# Patient Record
Sex: Female | Born: 1985 | Race: Asian | Hispanic: No
Health system: Western US, Academic
[De-identification: ages and names within clinical notes are randomized; demographics above are authoritative.]

## PROBLEM LIST (undated history)

## (undated) DIAGNOSIS — L309 Dermatitis, unspecified: Secondary | ICD-10-CM

## (undated) HISTORY — PX: WISDOM TOOTH EXTRACTION: SHX21

## (undated) HISTORY — DX: Dermatitis, unspecified: L30.9

## (undated) DEATH — deceased

---
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
## 2008-09-22 ENCOUNTER — Other Ambulatory Visit (INDEPENDENT_AMBULATORY_CARE_PROVIDER_SITE_OTHER): Payer: Self-pay | Admitting: Nurse Practitioner

## 2008-09-22 ENCOUNTER — Other Ambulatory Visit (INDEPENDENT_AMBULATORY_CARE_PROVIDER_SITE_OTHER): Payer: 59

## 2008-09-22 DIAGNOSIS — Z1159 Encounter for screening for other viral diseases: Secondary | ICD-10-CM

## 2008-09-22 DIAGNOSIS — Z111 Encounter for screening for respiratory tuberculosis: Secondary | ICD-10-CM

## 2008-09-22 DIAGNOSIS — Z23 Encounter for immunization: Secondary | ICD-10-CM

## 2008-09-22 NOTE — Progress Notes (Signed)
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    Patient pregnant at this time?: No  (Pregnancy question asked if patient receiving Tdap or Flu.   No research done for Tdap and pregnancy.  Use preservative free Flu vaccine if pregnant.)    Has had a problem with any vaccines received in the past: NO    VIS read and immunization informed consent given by patient: YES --     Injection given today w/o adverse effect: Hepatitis B booster and Tdap          TITER DOCUMENTATION    Patient to lab for titer:  Varicella    "Hepatitis Serology Testing Information for [facility] Students"   given to patient: YES    Patient instructed to go to the lab for blood draw and informed that titer results will be mailed within two weeks.

## 2008-09-23 LAB — VZV IMMUNE STATUS BY IFA: Varicella Zoster Immune Status Result: >1:8 {titer}

## 2008-09-25 ENCOUNTER — Other Ambulatory Visit (INDEPENDENT_AMBULATORY_CARE_PROVIDER_SITE_OTHER): Payer: 59

## 2008-09-25 DIAGNOSIS — Z111 Encounter for screening for respiratory tuberculosis: Secondary | ICD-10-CM

## 2008-09-25 LAB — PR PPD/TUBERCULIN SKIN TEST 5 UNITS / 0.1 ML INTRADERMAL: PPD: 0

## 2008-09-25 NOTE — Progress Notes (Signed)
CONSENT    Patient consent to give immunization record to HSIP nurse:   YES    Reason for PPD?  HSIP    PPD read 00 mm.

## 2008-10-27 ENCOUNTER — Other Ambulatory Visit (INDEPENDENT_AMBULATORY_CARE_PROVIDER_SITE_OTHER): Payer: 59

## 2008-10-27 DIAGNOSIS — Z7189 Other specified counseling: Secondary | ICD-10-CM

## 2008-10-27 NOTE — Progress Notes (Signed)
To Lab for Hep B titer.  Annamary Carolin, RN

## 2008-11-04 ENCOUNTER — Other Ambulatory Visit (INDEPENDENT_AMBULATORY_CARE_PROVIDER_SITE_OTHER): Payer: 59

## 2008-11-04 DIAGNOSIS — Z23 Encounter for immunization: Secondary | ICD-10-CM

## 2008-11-04 NOTE — Progress Notes (Signed)
Flu Vaccine Screening Questions    Is patient pregnant at this time?  NO    Has the patient ever had a serious reaction to a prior dose of Influenza vaccine? No    Ever had a serious allergic reaction to eggs? No    Ever had Guillan-Barre syndrome associated with a vaccine? No    Currently have a moderate or severe illness, including a fever? No    Patient has reviewed the Influenza VIS and had all questions answered? YES    Verbal informed consent for Influenza vaccine obtained: YES     Flu vaccine given today without initial adverse effect.

## 2009-01-04 ENCOUNTER — Encounter (HOSPITAL_BASED_OUTPATIENT_CLINIC_OR_DEPARTMENT_OTHER): Payer: Self-pay | Admitting: Obstetrics & Gynecology

## 2009-01-08 ENCOUNTER — Ambulatory Visit: Payer: BLUE CROSS/BLUE SHIELD | Attending: Obstetrics & Gynecology | Admitting: Obstetrics & Gynecology

## 2009-01-08 DIAGNOSIS — Z139 Encounter for screening, unspecified: Secondary | ICD-10-CM | POA: Insufficient documentation

## 2009-01-08 DIAGNOSIS — Z01419 Encounter for gynecological examination (general) (routine) without abnormal findings: Secondary | ICD-10-CM

## 2009-01-18 ENCOUNTER — Encounter (HOSPITAL_BASED_OUTPATIENT_CLINIC_OR_DEPARTMENT_OTHER): Payer: Self-pay | Admitting: Obstetrics & Gynecology

## 2010-12-12 ENCOUNTER — Encounter (INDEPENDENT_AMBULATORY_CARE_PROVIDER_SITE_OTHER): Payer: Self-pay

## 2011-11-30 ENCOUNTER — Encounter (HOSPITAL_BASED_OUTPATIENT_CLINIC_OR_DEPARTMENT_OTHER): Payer: Self-pay | Admitting: Obstetrics & Gynecology

## 2011-11-30 ENCOUNTER — Ambulatory Visit: Payer: 59 | Attending: Obstetrics & Gynecology | Admitting: Obstetrics & Gynecology

## 2011-11-30 VITALS — BP 113/73 | HR 82 | Ht 62.0 in | Wt 135.0 lb

## 2011-11-30 DIAGNOSIS — L259 Unspecified contact dermatitis, unspecified cause: Secondary | ICD-10-CM | POA: Insufficient documentation

## 2011-11-30 DIAGNOSIS — B379 Candidiasis, unspecified: Secondary | ICD-10-CM

## 2011-11-30 DIAGNOSIS — B49 Unspecified mycosis: Secondary | ICD-10-CM

## 2011-11-30 DIAGNOSIS — B373 Candidiasis of vulva and vagina: Secondary | ICD-10-CM | POA: Insufficient documentation

## 2011-11-30 DIAGNOSIS — L309 Dermatitis, unspecified: Secondary | ICD-10-CM | POA: Insufficient documentation

## 2011-11-30 DIAGNOSIS — Z01419 Encounter for gynecological examination (general) (routine) without abnormal findings: Secondary | ICD-10-CM | POA: Insufficient documentation

## 2011-11-30 DIAGNOSIS — R300 Dysuria: Secondary | ICD-10-CM | POA: Insufficient documentation

## 2011-11-30 DIAGNOSIS — B3731 Acute candidiasis of vulva and vagina: Secondary | ICD-10-CM | POA: Insufficient documentation

## 2011-11-30 LAB — PR U/A NONAUTO DIPSTICK ONLY, ONSITE
Bilirubin (Indirect): NEGATIVE
Glucose, Urine: NEGATIVE mg/dL
Ketones, URN: NEGATIVE mg/dL
Leukocytes: NEGATIVE
Nitrite, URN: NEGATIVE
Protein: NEGATIVE mg/dL
pH, Whole Blood: 6 (ref 5.0–8.0)

## 2011-11-30 LAB — PR WET MOUNTS INCL PREP VAGINAL CERV/SKIN SPECIMENS, ONSITE

## 2011-11-30 MED ORDER — FLUCONAZOLE 150 MG OR TABS
ORAL_TABLET | ORAL | Status: AC
Start: 2011-11-30 — End: ?

## 2011-11-30 NOTE — Progress Notes (Signed)
Gynecologic Preventive Health Visit    ID/CC: 26 year old female presents for her annual preventive health visit    HPI: Amy Benson presents for the above.     3 months ago had a UTI which was treated with abx.  Then a couple of weeks ago had UTI which was not treated with abx and also had a yeast infection.  The yeast infection was new for her, had itching and white discharge.  Used OTC and resolved.  Both of the UTI sx were right after sex. Not having sex that often.  Not having these sx after every episode of sex.     Currently has a vague dysuria, using cranberry pills.      Has had mid-cycle spotting for last 2 cycles, spotting lasted 1-2 days.     Using condoms for birth control, but in last few months feeling more irritated even with latex free condoms.     OB/Gynecologic history:  1. Menses: LMP was Patient's last menstrual period was 11/23/2011. ,   Contraception: currently using condoms.   STI: none  Pap smear history: no h/o abnormal    G0P0    ROS:  Extended 2-9, Complete 10+  ROS per patient's intake form dated 11/30/2011.      Past Medical History  Past Medical History   Diagnosis Date   . Eczema      Past Surgical History  Past Surgical History   Procedure Date   . Removal of tonsils      Family history:   No family history on file.  SocHx:  History     Social History   . Marital Status: Single     Spouse Name: N/A     Number of Children: N/A   . Years of Education: N/A     Occupational History   . Not on file.     Social History Main Topics   . Smoking status: Never Smoker    . Smokeless tobacco: Not on file   . Alcohol Use:    . Drug Use:    . Sexually Active:      Other Topics Concern   . 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        Physical Exam:  1995   Detailed - 5-7 systems, Comprehensive -8+  Filed Vitals:    11/30/11 1017   BP: 113/73   Pulse: 82   Height: 5\' 2"  (1.575 m)   Weight: 135 lb (61.236 kg)       Gen: well appearing,  nad, ambulates without assistance   Neurological: gait normal.   Psychiatric:oriented x 3, mood and affect appropriate.  Neck: : no masses or thyromegaly  Breasts: symmetric, no masses, nipple discharge or lymphadenopathy  Lymphatic: no axillary or inguinal lymphadenopathy  Cardiovascular: regular rate, 2+ peripheral pulses  Respiratory: good respiratory effort, no use of accessory muscles  Gastrointestinal: soft, nontender, nondistended.  No masses, hernias, or hepatosplenomegaly.  Genitourinary: normal external female genitalia, normal bartholins, skenes, urethral meatus, anus, and perineum.  Cervix  nulliparous without lesions. Bimanual, uterus anteverted, mobile, nontender, no adnexal masses or tenderness.    Skin: on abd and back: patches of pink, scaly skin.     Office Visit on 11/30/11   U/A NONAUTO DIPSTICK ONLY       Component Value Range    Color, URN yellow  Clarity, URN clear      Glucose, Urine neg  NEG mg/dL    Bilirubin (Indirect) neg  NEG    Ketones, URN neg  NEG mg/dL    Specific Gravity    1.005 - 1.030    Occult Blood, URN hemolyzed trace  NEG    Ph 6  5.0 - 8.0    Protein neg  NEG-TRACE mg/dL    Urobilinogen, URN norm  NORMAL E.U./dL    Nitrite, URN neg  NEG    Leukocytes neg  NEG   WET MOUNTS W/PREP VAG CERV/SKN SPEC       Component Value Range    Odor none      WBC 1+      RBC none      Bacteria 2+      Clue Cells none      Yeast 2+      Epithelial Cells 3+      Trich none      Other pH 4.5           Impression: 25 year old female presents for preventive health exam and age-appropriate counseling.    ASSESSMENT AND PLAN:     1. Preventive Health Visit   Pap smear: done today. If negative, can space q3 years   Chlamydia/Gonorrhea screen: declined  Screening for other STD's including HIV: declined   Cholesterol screening: n/a   Mammography: n/a  Colonoscopy: n/a   Preventive counseling: contraceptive needs, MVI w/folate to prevent NTD's, breast self-exam, skin Ca prevention & skin self-exam,  importance of regular dental care, moderation in EtOH, cessation of tobacco use, use of illicit drugs, healthy dietary guidelines, adequate calcium intake  (1200-1500mg /d) and vitamin D (600-800IU/d), proper exercise.     2. Immunizations:   Has had flu vaccine at work    3. BCM: will continue to use condoms unless consistently irritated    4. Yeast vaginitis:  Fluconazole 150 mg PO x 1    5. Eczema: encouraged her to use the steroid ointment. F/u with derm if not improved.

## 2011-12-02 LAB — URINE C/S: Colony Count: 4000

## 2011-12-04 ENCOUNTER — Encounter (HOSPITAL_BASED_OUTPATIENT_CLINIC_OR_DEPARTMENT_OTHER): Payer: Self-pay | Admitting: Obstetrics & Gynecology

## 2011-12-04 LAB — CERVICAL CANCER SCREENING: Cytologic Impression: NEGATIVE

## 2012-01-10 ENCOUNTER — Ambulatory Visit: Payer: 59 | Attending: Dermatology | Admitting: Dermatology

## 2012-01-10 DIAGNOSIS — R21 Rash and other nonspecific skin eruption: Secondary | ICD-10-CM | POA: Insufficient documentation

## 2012-02-23 ENCOUNTER — Encounter (HOSPITAL_BASED_OUTPATIENT_CLINIC_OR_DEPARTMENT_OTHER): Payer: 59 | Admitting: Dermatology

## 2012-02-28 ENCOUNTER — Encounter (HOSPITAL_BASED_OUTPATIENT_CLINIC_OR_DEPARTMENT_OTHER): Payer: 59 | Admitting: Dermatology

## 2012-12-26 ENCOUNTER — Other Ambulatory Visit: Payer: Self-pay

## 2013-05-21 LAB — HM PAP SMEAR: HM Pap smear: NORMAL

## 2013-09-09 ENCOUNTER — Other Ambulatory Visit: Payer: Self-pay | Admitting: Occupational Medicine

## 2013-09-09 ENCOUNTER — Ambulatory Visit: Payer: Self-pay

## 2013-09-09 DIAGNOSIS — R7612 Nonspecific reaction to cell mediated immunity measurement of gamma interferon antigen response without active tuberculosis: Secondary | ICD-10-CM

## 2014-01-01 ENCOUNTER — Other Ambulatory Visit: Payer: Self-pay

## 2014-02-27 ENCOUNTER — Encounter: Payer: Self-pay | Admitting: Family

## 2014-02-27 ENCOUNTER — Ambulatory Visit (INDEPENDENT_AMBULATORY_CARE_PROVIDER_SITE_OTHER): Payer: 59 | Admitting: Family

## 2014-02-27 VITALS — BP 90/70 | HR 56 | Resp 16 | Ht 63.0 in | Wt 133.6 lb

## 2014-02-27 DIAGNOSIS — R591 Generalized enlarged lymph nodes: Secondary | ICD-10-CM

## 2014-02-27 DIAGNOSIS — R599 Enlarged lymph nodes, unspecified: Secondary | ICD-10-CM

## 2014-02-27 DIAGNOSIS — Z331 Pregnant state, incidental: Secondary | ICD-10-CM

## 2014-02-27 DIAGNOSIS — K219 Gastro-esophageal reflux disease without esophagitis: Secondary | ICD-10-CM

## 2014-02-27 DIAGNOSIS — Z3490 Encounter for supervision of normal pregnancy, unspecified, unspecified trimester: Secondary | ICD-10-CM | POA: Insufficient documentation

## 2014-02-27 MED ORDER — PRENATAL VITAMINS 0.8 MG PO TABS: 1.0000 | ORAL_TABLET | Freq: Every day | ORAL | Status: AC

## 2014-02-27 NOTE — Assessment & Plan Note (Signed)
Resolved. Advised pt to call if LN enlarges again.

## 2014-02-27 NOTE — Patient Instructions (Signed)
Please keep your upcoming appointment with OB/GYN. Continue prenatal vitamin. Follow up after 4/16 for complete physical.

## 2014-02-27 NOTE — Assessment & Plan Note (Signed)
Stable on PPI.  Pregnancy category B. Continue same.

## 2014-02-27 NOTE — Assessment & Plan Note (Signed)
Continue Prenatal vitamins. Management per OB.

## 2014-02-27 NOTE — Progress Notes (Signed)
Subjective:    Patient ID: Desiree Kim, female    DOB: 1985-09-05, 29 y.o.   MRN: [id number]  HPI   Desiree Kim is a 29 yr old female who presents today to establish care. She is [redacted] weeks pregnant- sees Dr. Renaldo Fiddler.   GERD- Pt is currently on protonix, reports that her GYN is aware that she is taking protonix.  Reports that last January she had endoscopy- looked "ok" per pt.  GERD symptoms worsened initially with her pregnancy but are now under better control since she started protonix.    Reports that she has had a swollen lymph node x several months on the right side of her neck. Now improved. Denies fevers.     Review of Systems  Constitutional: Negative for unexpected weight change.  HENT: Negative for rhinorrhea.   Respiratory: Negative for cough and shortness of breath.   Cardiovascular: Negative for chest pain.  Gastrointestinal: Negative for diarrhea and constipation.       Some nausea due to pregnancy  Genitourinary: Negative for dysuria and frequency.  Musculoskeletal: Negative for myalgias and arthralgias.  Skin: Negative for rash.  Neurological: Negative for headaches.  Hematological: Positive for adenopathy.  Psychiatric/Behavioral:       Denies depression/anxiety   History reviewed. No pertinent past medical history.  History   Social History  . Marital Status: Married    Spouse Name: N/A    Number of Children: N/A  . Years of Education: N/A   Occupational History  . Not on file.   Social History Main Topics  . Smoking status: Never Smoker   . Smokeless tobacco: Never Used  . Alcohol Use: No  . Drug Use: Not on file  . Sexual Activity: Not on file   Other Topics Concern  . Not on file   Social History Narrative   Pediatric nurse at [employer]   Originally from [place]   Married   Enjoys hanging out watching TV, exercise- enjoys tennis.    Past Surgical History  Procedure Laterality Date  . Tonsillectomy and adenoidectomy  1993  . Wisdom tooth  extraction      Family History  Problem Relation Age of Onset  . Hyperlipidemia Mother   . Hypertension Mother   . Other Mother     pre-diabetes  . Atrial fibrillation Father   . Diabetes Maternal Grandfather   . Cancer Paternal Grandfather     stomach  . Klinefelter's syndrome Brother     No Known Allergies  No current outpatient prescriptions on file prior to visit.   No current facility-administered medications on file prior to visit.    BP 90/70 mmHg  Pulse 56  Resp 16  Ht  (1.6 m)  Wt 133 lb 9.6 oz (60.601 kg)  BMI 23.67 kg/m2  SpO2 97%  LMP 12/04/2013       Objective:   Physical Exam  Constitutional: She is oriented to person, place, and time. She appears well-developed and well-nourished. No distress.  HENT:  Head: Normocephalic and atraumatic.  Right Ear: Tympanic membrane and ear canal normal.  Left Ear: Tympanic membrane and ear canal normal.  Mouth/Throat: No oropharyngeal exudate, posterior oropharyngeal edema or posterior oropharyngeal erythema.  Neck: Neck supple.  Non-tender, mobile sub centimeter lymph node noted beneath the right jaw.    Cardiovascular: Normal rate and regular rhythm.   No murmur heard. Pulmonary/Chest: Effort normal and breath sounds normal. No respiratory distress. She has no wheezes. She has no rales.  She exhibits no tenderness.  Abdominal: Soft. She exhibits no distension.  Neurological: She is alert and oriented to person, place, and time.  Skin: Skin is warm and dry.  Psychiatric: She has a normal mood and affect. Her behavior is normal. Judgment and thought content normal.          Assessment & Plan:

## 2014-09-30 ENCOUNTER — Inpatient Hospital Stay (HOSPITAL_COMMUNITY): Admission: AD | Admit: 2014-09-30 | Payer: 59 | Source: Ambulatory Visit | Admitting: Obstetrics and Gynecology

## 2015-04-07 ENCOUNTER — Other Ambulatory Visit: Payer: Self-pay

## 2015-12-30 IMAGING — CR DG CHEST 1V
1 series · 1 of 1 positions shown · non-contrast
Comparison: None.

CLINICAL DATA: Positive TB test.

EXAM:
CHEST - 1 VIEW

[view not recorded]
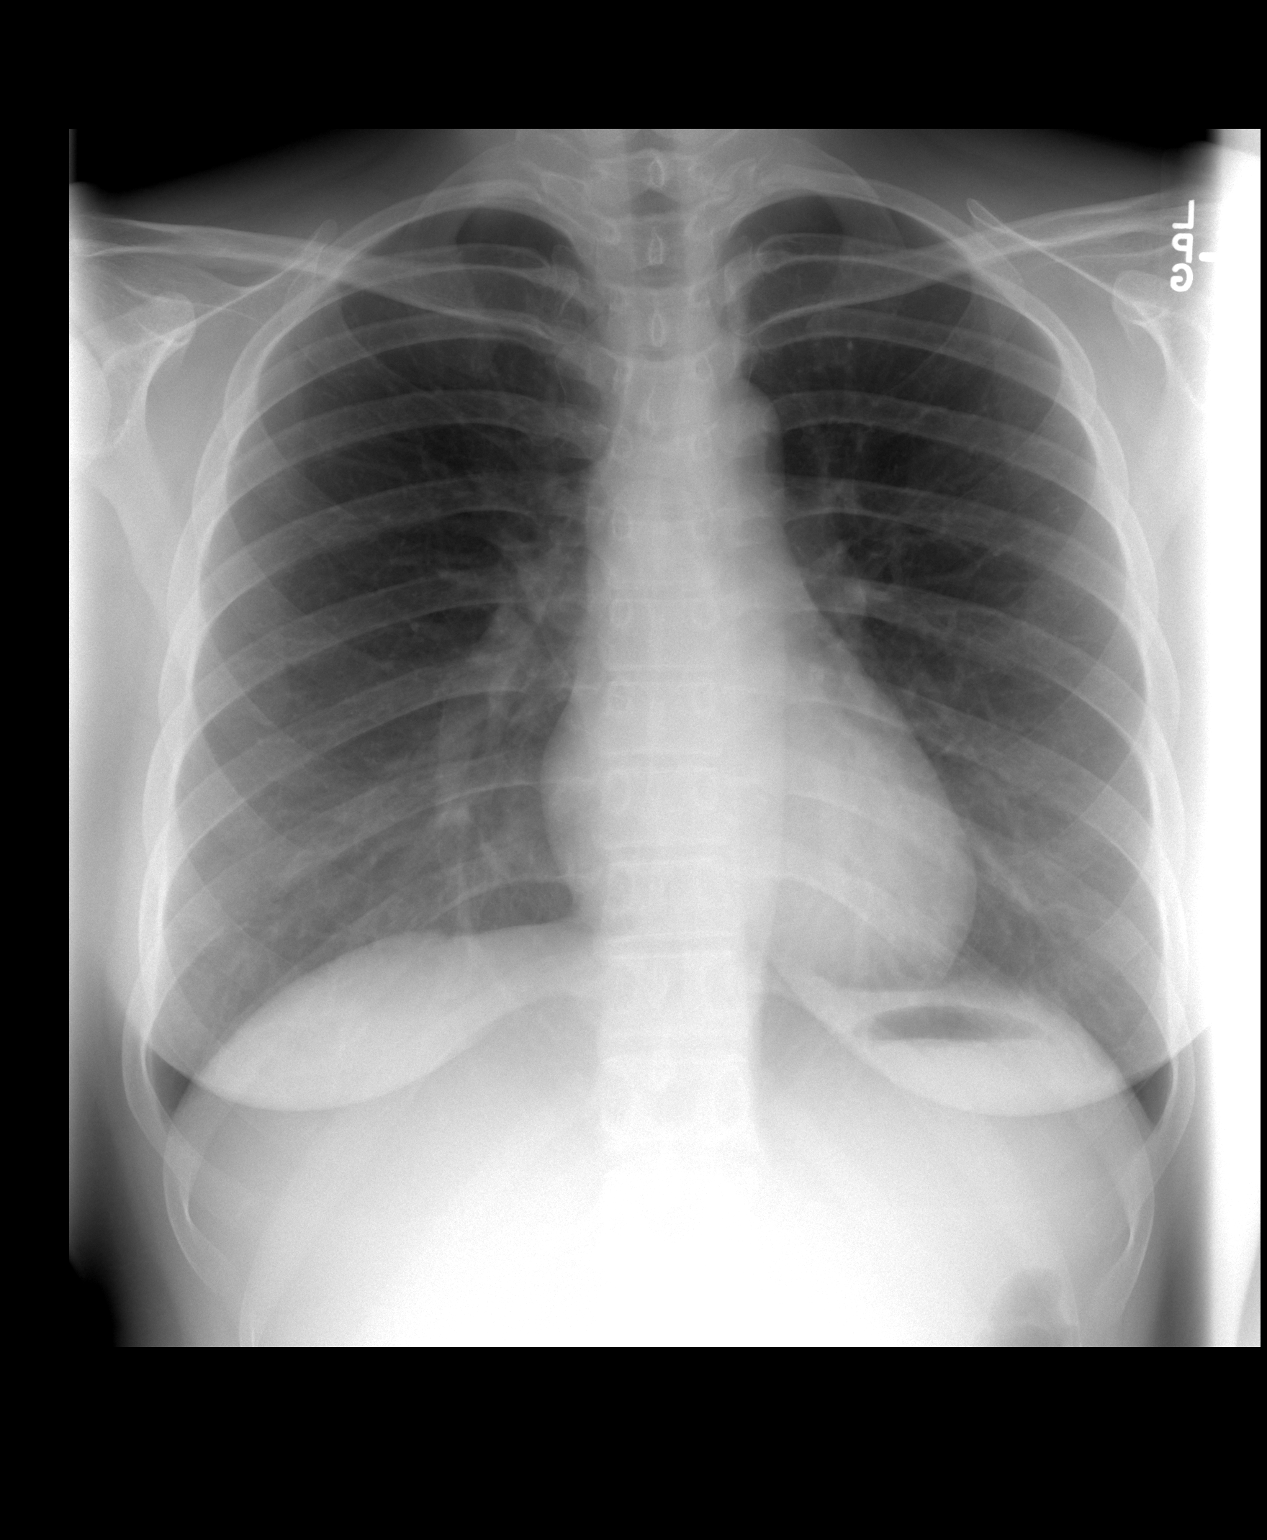

[1 of 1 positions shown; findings below may reference images not displayed]

FINDINGS: Mediastinum hilar structures normal. Lungs are clear. No focal
pulmonary infiltrate. No pleural effusion or pneumothorax. No
radiographic evidence for TB. Heart size normal. No acute bony
abnormality.
IMPRESSION: Negative chest.

## 2017-05-08 LAB — OTHER LAB ORDER
Hemoglobin A1C: 5.2 % — NL (ref 4.8–5.6)
Mean Blood Glucose Estimate: 103 mg/dL — NL

## 2018-03-25 ENCOUNTER — Encounter (HOSPITAL_BASED_OUTPATIENT_CLINIC_OR_DEPARTMENT_OTHER): Payer: Self-pay | Admitting: Psychiatry

## 2018-03-25 ENCOUNTER — Encounter (HOSPITAL_BASED_OUTPATIENT_CLINIC_OR_DEPARTMENT_OTHER): Payer: No Typology Code available for payment source | Admitting: Psychiatry

## 2018-03-25 NOTE — Telephone Encounter (Signed)
Transfer to clinc

## 2018-04-22 ENCOUNTER — Encounter (HOSPITAL_BASED_OUTPATIENT_CLINIC_OR_DEPARTMENT_OTHER): Payer: No Typology Code available for payment source | Admitting: Psychiatry

## 2018-05-20 ENCOUNTER — Telehealth (HOSPITAL_BASED_OUTPATIENT_CLINIC_OR_DEPARTMENT_OTHER): Payer: Self-pay

## 2018-05-20 NOTE — Telephone Encounter (Signed)
RETURN CALL: Voicemail - Detailed Message      SUBJECT:  Appointment Request     REASON FOR VISIT: telehealth/ per referral  PREFERRED DATE/TIME: please coordinate with patient  ADDITIONAL INFORMATION: no telehealth scheduling instructions/ Ccr attempted to transfer but all staff was assisting other patients.

## 2018-05-22 ENCOUNTER — Encounter (HOSPITAL_BASED_OUTPATIENT_CLINIC_OR_DEPARTMENT_OTHER): Payer: Self-pay

## 2018-05-22 NOTE — Telephone Encounter (Signed)
Patient scheduled for a telehealth appt with Dr. Nona Dell

## 2018-05-27 ENCOUNTER — Ambulatory Visit: Payer: No Typology Code available for payment source | Admitting: Psychiatry

## 2018-05-27 DIAGNOSIS — F419 Anxiety disorder, unspecified: Secondary | ICD-10-CM

## 2018-05-27 NOTE — Progress Notes (Signed)
PERINATAL PSYCHIATRY CLINIC INITIAL EVALUATION      DATE: 05/27/2018   TYPE OF SERVICE: Initial Evaluation      This telehealth patient encounter was conducted from the [facility] in [place], [place], via secure, live, face to face video conferencing to the patient.   This visit was conducted via telehealth instead of face-to-face because of the risk of COVID-19 exposure inherent in being physically present in the company of others. Briefly reviewed risks and benefits of telemedicine, and billing for telemedicine, and patient agreed to proceed with telemedicine appointment.    Patient's location during encounter:     Location description (home, office, etc.) Home  Street address: [address]  [place] and state: [place], [place]    Telephone number used for this encounter: zoom  Emergency contact name and telephone number reviewed in patient chart and is up to date: Yes    In addition to the patient and the provider, the following others were present during the encounter:None    Prior to the interview, the provider verified the patient's identity by asking for his or her name and date of birth. The provider informed the patient of their physical location and showed his or her badge. No recordings are kept from this encounter.     Emergency plan:  In the event of an emergency, the provider may ask the patient and/or family member/caregiver to contact 911. If it is not possible for the patient or someone at their location to contact 911, the provider will contact 911 and provide the patient's location. The patient was informed of this safety plan and verbally consented to it.       ID: Amy Benson 33 year old female with anxiety here for preconception counseling    EDD / DOD: N/A    CC: "I want more information about Lexapro in pregnancy"    HPI:  Amy Benson had onset of symptoms 3 years ago in the immediate postpartum period. She developed  spontaneous episodes of symptoms which she refers to as "psychosomatic symptoms" -  lightheadedness and heat intolerance which lasted for 30 - 40 min. Denies palpitation, shortness of breath worries or fear however does report that over the course of several months she began to avoid identified triggers of these episodes including being out in the sun, leading her to reduce how much she played outside with her daughter. Another trigger she avoids is driving on the freeway for more than about 15 min as she develops similar symptoms then. She never has episodes at home or at work.    She also reports fatigue when she went back to work at 5 months postpartum, but that has improved over time, and does not interfere with her functioning.  She denies pervasive low mood, hopelessness, irritability, decreased concentration.    She says she is not a Chiropractor" and does not get anxious even in the context of stressors. Her 44 year old daughter Amy Benson special needs with a rare neurodevelopmental disorder related to GRIN2B mutation and manifesting as developmental delay, hypotonia and autism spectrum disorder. She is able to walk but has feeding difficulties and has a NGT. Amy Benson did not want to dwell on it as she feels she is coping adequately and it is unrelated to her presenting complaint.     She tried several interventions including detailed assessment by endocrinology which was normal, cortisol treatment from a naturopath, acupuncture, chinese herbal medicine and when these were ineffective she has been  receiving escitalopram and occasional [place]praz[place]am since Oct 2019 from [place], with good effect. She had a headache and nausea when she started the medication which resolved and she is now on 7.5 mg daily.    She denies symptoms of hypomania / mania/ psychosis / OCD.    Screening questionnaire scores:  AUDIT neg  DAST neg  PCL 5 neg  MDQ neg  PHQ-9 0  GAD 1    PAST PSYCHIATRIC HISTORY:  Inpatient: None  Outpatient:  Amy Benson, [place] since Aug 2019  Peripartum: 3 years ago - postpartum onset of anxiety symptoms  Diagnoses:Anxiety, panic disorder  Medications: Escitalopram 7.5 mg daily, Alprazolam 0.5 mg as needed (1 - 2 / month)  Suicide attempts:None    PAST MEDICAL HISTORY:  Assessed by endocrinology - normal    OBSTETRIC HISTORY:  Pregnancy history: G1  Labor and delivery:NSVD  Breastfeeding: breastfed for 4 months, pumped for 11. 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      DRUG AND ALCOHOL/OTHER:   None  FAMILY HISTORY:  None    LABS:  TSH 3/19 normal    ALLERGIES:  NKDA    PERTINENT ROS:     Nausea No, Denies problems in this area       Rigidity/stiffness No, Denies problems in this area  Dizziness No, Denies problems in this area  Diarrhea No, Denies problems in this area  Sedation No, Denies problems in this area  Shortness of breath No, Denies problems in this area  Tremor No, Denies problems in this area  Headache No, Denies problems in this area  Other: see HPI    MENTAL STATUS EXAMINATION:  Well groomed, good eye contact, well engaged in interview. No psychomotor agitation or retardation evident on camera. Good eye contact. Speech regular rate and rhythm. Mood ok Affect euthymic. Thought process linear logical and goal directed. Thought content no SI/ HI/ AVH. Oriented. Attention normal. Memory normal on interview. Insight and Judgment normal.    MEDICATION SIDE EFFECTS:   None  MEDICATIONS:   Current Outpatient Medications   Medication Sig Dispense Refill   . Fluconazole 150 MG Oral Tab Take 1 tablet by mouth as a single dose 1 Tab 0     No current facility-administered medications for this visit.        MEDICATION COMPLIANCE: Good    TREATMENT PLAN REVIEWED?      ASSESSMENT: 33 yo  married G1 with anxiety, presenting for preconception counseling. She has atypical anxiety symptoms which have responded to 7.5 mg escitalopram and is requesting information on its safety in pregnancy. She has been evaluated by her PCP and by Endocrinology with no indication of contributing medical problems. We discussed known risks of escitalopram in pregnancy including small risk of preterm birth / low birth weight, persistent pulmonary hypertension, and more consistently reported risk of neonatal adaptation syndrome, and no increased risk of congenital malformations. We discussed the risks of untreated anxiety during pregnancy and the role of behavioral interventions and psychotherapy. As her symptoms are atypical she would probably benefit from exposure response prevention. We discussed that if she wants to trial off medication we would ideally like to monitor for sustained remission of symptoms for at least 3  months before she conceives. She will discuss all of this with her husband and return for consultation.    DIAGNOSES:   Anxiety Disorder unspecified.    TREATMENT/INTERVENTION:   Continue escitalopram 7.5 mg  Return in 2 weeks

## 2018-05-28 ENCOUNTER — Encounter (HOSPITAL_BASED_OUTPATIENT_CLINIC_OR_DEPARTMENT_OTHER): Payer: Self-pay

## 2018-06-03 ENCOUNTER — Ambulatory Visit: Payer: No Typology Code available for payment source | Attending: Psychiatry | Admitting: Psychiatry

## 2018-06-03 ENCOUNTER — Encounter (HOSPITAL_BASED_OUTPATIENT_CLINIC_OR_DEPARTMENT_OTHER): Payer: Self-pay

## 2018-06-03 DIAGNOSIS — F419 Anxiety disorder, unspecified: Secondary | ICD-10-CM | POA: Insufficient documentation

## 2018-06-03 MED ORDER — SERTRALINE HCL 25 MG OR TABS
ORAL_TABLET | ORAL | 2 refills | Status: AC
Start: 2018-06-03 — End: ?

## 2018-06-03 NOTE — Progress Notes (Signed)
PERINATAL PSYCHIATRY CLINIC PROGRESS NOTE    DATE: 06/03/2018   TYPE OF SERVICE: Evaluation and management     This telehealth patient encounter was conducted from the [facility] [place], [place], via secure, live, face to face video conferencing to the patient.   This visit was conducted via telehealth instead of face-to-face because of the risk of COVID-19 exposure inherent in being physically present in the company of others.    Briefly reviewed risks and benefits of telemedicine, and billing for telemedicine, and patient agreed to proceed with telemedicine appointment.    Patient's location during encounter:     Location description (home, office, etc.) Home  Street address: [address] [place]    Telephone number used for this encounter: zoom  Emergency contact name and telephone number reviewed in patient chart and is up to date: yes  In addition to the patient and the provider, the following others were present during the encounter: None    Prior to the interview, the provider verified the patient's identity by asking for his or her name and date of birth. The provider informed the patient of their physical location and showed his or her badge. No recordings are kept from this encounter.     Emergency plan:  In the event of an emergency, the provider may ask the patient and/or family member/caregiver to contact 911. If it is not possible for the patient or someone at their location to contact 911, the provider will contact 911 and provide the patient's location. The patient was informed of this safety plan and verbally consented to it.     ID: Amy Benson 33 year old female with 33 year old female with anxiety here for preconception counseling    Chief Complaint  Doing well    EDD / DOD:  N/A    PROBLEM/INTERVAL HISTORY:   Has thought over our discussion from last week and discussed it with her husband.  [facility] [place] [facility] [place]  for a few months in the summer and sees this as a time of potentially increase stress. She would like to try tapering off meds in September, allowing for 3 months monitoring off meds before she starts trying to conceive.  In the meantime she wants to cross taper to sertraline given that there is more safety data for its use in pregnancy. This way, even if she tapers off meds before she gets pregnant we will know whether or not she responds to sertraline in case she ends up needing medications while pregnant.  Has concerns about being on too high a dose of meds. We discussed the importance of being on the minimum effective dose.     DRUG AND ALCOHOL/OTHER:   None    PERTINENT ROS:     Nausea No, Denies problems in this area       Rigidity/stiffness No, Denies problems in this area  Dizziness No, Denies problems in this area  Diarrhea No, Denies problems in this area  Sedation No, Denies problems in this area  Shortness of breath No, Denies problems in this area  Tremor No, Denies problems in this area  Headache No, Denies problems in this area  Other: see HPI    UPDATED PAST PSYCHIATRIC, MEDICAL, FAMILY, SOCIAL HISTORY  Reviewed, sister with postpartum anxiety, on sertraline.    MENTAL STATUS EXAMINATION:  Appearance: Well groomed, good eye contact, engaged well.  Behavior / motor:No psychomotor  agitation or retardation visible on video camera  Speech: Regular rate and rhythm.  Mood: good  Affect:euthymic  Thought form: linear, logical and goal directed  Thought content:No SI/HI/AVH  Attention:normal  Orientation:oriented  Memory:normal on interview  Insight:Good  Judgment:Good    MEDICATION SIDE EFFECTS:   None    MEDICATIONS:   Escitalopram 7.5 mg  Prenatal vitamins      MEDICATION COMPLIANCE: Good    TREATMENT PLAN REVIEWED?  YES    SAFETY ASSESSMENT:  Historical and predisposing risk factors: depression and anxiety  Dynamic risk factors:none  Protective factors:engaged in treatment, social support  Imminence  (ideation, intent, plan, means, preparation):none    ASSESSMENT: 33 yo married G1 with anxiety, presenting for preconception counseling. She has atypical anxiety symptoms which have responded to 7.5 mg escitalopram and is requesting information on its safety in pregnancy. She has been evaluated by her PCP and by Endocrinology with no indication of contributing medical problems. We discussed known risks of escitalopram in pregnancy including small risk of preterm birth / low birth weight, persistent pulmonary hypertension, and more consistently reported risk of neonatal adaptation syndrome, and no increased risk of congenital malformations. We discussed the risks of untreated anxiety during pregnancy and the role of behavioral interventions and psychotherapy. As her symptoms are atypical she would probably benefit from exposure response prevention. We discussed that if she wants to trial off medication we would ideally like to monitor for sustained remission of symptoms for at least 3 months before she conceives. She would like to cross taper to sertraline for the next 6 - 7 months and then taper off it in preparation for pregnancy. She would like to do this in case she needs to be put back on meds when she is pregnant so that she knows whether or not she responds to sertraline. Planned cross taper, educated about symptoms of serotonin syndrome. She would like to also try some self help worksheets for anxiety as she does not want to try psychotherapy for now.    DIAGNOSES:   Anxiety Disorder unspecified.    TREATMENT/INTERVENTION:   Decrease escitalopram to 5 mg for 3 days then 2.5 mg for 3 days then stop.  On day 4, add sertraline 12.5 mg, then increase to 25 mg after 3 days.  Increase sertraline to 50 mg if anxiety symptom persist after 2 weeks.  Try worksheets at [facility]  Return in 1 month

## 2018-06-03 NOTE — Patient Instructions (Addendum)
Thank you for coming in today.    We discussed the following treatment plan for you:    Decrease escitalopram to 5 mg for 3 days then 2.5 mg for 3 days then stop.  On day 4 (when you are on 2.5 mg escitalopram), add sertraline 12.5 mg, then increase to 25 mg after 3 days (when you have stopped escitalopram).  Increase sertraline to 50 mg if anxiety symptoms persist after 2 weeks.    Try the worksheets at: (some of these may not apply to you)  http://www.lewis.biz/.au/Resources/Looking-After-Yourself/Anxiety    The Outpatient Psychiatry Clinic remains open during the COVID-19 public health crisis. If you need to reach your provider or schedule an appointment Monday through Friday during business hours, call the clinic at [phone] and select option 2 to reach front desk staff. There may be times that staff is not immediately available due to high call volumes. You can also call the [facility] at [phone] for help.    If you have questions about COVID-19 and how the [facility] is available to help you should you have cold or flu symptoms, or have a close contact who has COVID-19 visit this link: [email or web address].      You can contact me via ecare and I will respond  within 1-2 business days.     You may also call the [facility] at [phone] (choose option #8) 8 am to 5 pm Mon to Fri to leave me a message.      If you need to speak with a psychiatric provider after-hours about a non-emergent issue, please call the [facility] operator at [phone]. Explain to the operator that you are a patient at the [facility] and that you need to speak with the on-call psychiatry resident. The operator will then connect you to the resident.     If you are in crisis after hours or on the weekend, or during the week and do not receive a call back from [place] as quickly as you need, please call 911 or go to nearest  emergency room. The following crisis resources are available 24 hours per day 7 days per week:    [facility]: 866-4-CRISIS    National Suicide Prevention Lifeline: (862) 006-9206:    Crisis support via text message: Text HOME to 984-566-0740    Crisis support via Chat:suicidepreventionlifeline.org

## 2018-06-06 ENCOUNTER — Encounter (HOSPITAL_BASED_OUTPATIENT_CLINIC_OR_DEPARTMENT_OTHER): Payer: Self-pay | Admitting: Psychiatry

## 2018-06-12 ENCOUNTER — Encounter (HOSPITAL_BASED_OUTPATIENT_CLINIC_OR_DEPARTMENT_OTHER): Payer: Self-pay | Admitting: Psychiatry

## 2018-07-01 ENCOUNTER — Telehealth (HOSPITAL_BASED_OUTPATIENT_CLINIC_OR_DEPARTMENT_OTHER): Payer: No Typology Code available for payment source | Admitting: Psychiatry
